# Patient Record
Sex: Male | Born: 1980
Health system: Southern US, Community
[De-identification: ages and names within clinical notes are randomized; demographics above are authoritative.]

## PROBLEM LIST (undated history)

## (undated) DIAGNOSIS — N200 Calculus of kidney: Secondary | ICD-10-CM

## (undated) DIAGNOSIS — F1020 Alcohol dependence, uncomplicated: Secondary | ICD-10-CM

## (undated) HISTORY — DX: Alcohol dependence, uncomplicated: F10.20

---

## 2002-03-22 ENCOUNTER — Emergency Department (HOSPITAL_COMMUNITY): Admission: EM | Admit: 2002-03-22 | Discharge: 2002-03-22 | Payer: Self-pay | Admitting: Emergency Medicine

## 2002-03-22 ENCOUNTER — Encounter: Payer: Self-pay | Admitting: Emergency Medicine

## 2006-09-02 ENCOUNTER — Emergency Department (HOSPITAL_COMMUNITY): Admission: EM | Admit: 2006-09-02 | Discharge: 2006-09-02 | Payer: Self-pay | Admitting: Family Medicine

## 2018-08-27 ENCOUNTER — Encounter (HOSPITAL_COMMUNITY): Payer: Self-pay | Admitting: Emergency Medicine

## 2018-08-27 ENCOUNTER — Emergency Department (HOSPITAL_COMMUNITY): Payer: BLUE CROSS/BLUE SHIELD

## 2018-08-27 ENCOUNTER — Emergency Department (HOSPITAL_COMMUNITY)
Admission: EM | Admit: 2018-08-27 | Discharge: 2018-08-27 | Disposition: A | Discharge status: Home / Self Care | Payer: BLUE CROSS/BLUE SHIELD | Attending: Emergency Medicine | Admitting: Emergency Medicine

## 2018-08-27 DIAGNOSIS — J069 Acute upper respiratory infection, unspecified: Secondary | ICD-10-CM | POA: Diagnosis not present

## 2018-08-27 DIAGNOSIS — R05 Cough: Secondary | ICD-10-CM | POA: Diagnosis not present

## 2018-08-27 DIAGNOSIS — R079 Chest pain, unspecified: Secondary | ICD-10-CM | POA: Diagnosis not present

## 2018-08-27 LAB — BASIC METABOLIC PANEL
Anion gap: 11 (ref 5–15)
BUN: 9 mg/dL (ref 6–20)
CO2: 26 mmol/L (ref 22–32)
Calcium: 9.1 mg/dL (ref 8.9–10.3)
Chloride: 101 mmol/L (ref 98–111)
Creatinine, Ser: 1.03 mg/dL (ref 0.61–1.24)
GFR calc Af Amer: >60 mL/min (ref >60)
GFR calc non Af Amer: >60 mL/min (ref >60)
Glucose, Bld: 101 mg/dL — ABNORMAL HIGH (ref 70–99)
Potassium: 3.9 mmol/L (ref 3.5–5.1)
Sodium: 138 mmol/L (ref 135–145)

## 2018-08-27 LAB — CBC
HCT: 47.8 % (ref 39.0–52.0)
Hemoglobin: 14.6 g/dL (ref 13.0–17.0)
MCH: 26.2 pg (ref 26.0–34.0)
MCHC: 30.5 g/dL (ref 30.0–36.0)
MCV: 85.7 fL (ref 80.0–100.0)
Platelets: 298 10*3/uL (ref 150–400)
RBC: 5.58 MIL/uL (ref 4.22–5.81)
RDW: 12.8 % (ref 11.5–15.5)
WBC: 8.6 10*3/uL (ref 4.0–10.5)
nRBC: 0 % (ref 0.0–0.2)

## 2018-08-27 MED ORDER — DOXYCYCLINE HYCLATE 100 MG PO TABS: 100.0000 mg | ORAL_TABLET | Freq: Once | ORAL | Status: DC

## 2018-08-27 MED ORDER — DOXYCYCLINE HYCLATE 100 MG PO CAPS: 100.0000 mg | ORAL_CAPSULE | Freq: Two times a day (BID) | ORAL | 0 refills | Status: DC

## 2018-08-27 MED ORDER — PREDNISONE 20 MG PO TABS: 60.0000 mg | ORAL_TABLET | Freq: Once | ORAL | Status: DC

## 2018-08-27 MED ORDER — ALBUTEROL SULFATE HFA 108 (90 BASE) MCG/ACT IN AERS: 2.0000 | INHALATION_SPRAY | Freq: Once | RESPIRATORY_TRACT | Status: DC

## 2018-08-27 MED ORDER — PREDNISONE 20 MG PO TABS: ORAL_TABLET | ORAL | 0 refills | Status: DC

## 2018-08-27 NOTE — ED Provider Notes (Signed)
Emergency Department Provider Note   I have reviewed the triage vital signs and the nursing notes.   HISTORY  Chief Complaint URI   HPI Erik Garza is a 37 y.o. male with 2 and half weeks of worsening productive cough, shortness of breath and pleuritic pain in the last day or 2.  No fevers.  No lower extremity swelling.  No abdominal symptoms. No vomiting. No other associated or modifying symptoms.    History reviewed. No pertinent past medical history.  There are no active problems to display for this patient.   History reviewed. No pertinent surgical history.  Current Outpatient Rx  . Order #: [id number] Class: Print  . Order #: [id number] Class: Print    Allergies Patient has no known allergies.  History reviewed. No pertinent family history.  Social History Social History   Tobacco Use  . Smoking status: Never Smoker  . Smokeless tobacco: Never Used  Substance Use Topics  . Alcohol use: Not on file  . Drug use: Not on file    Review of Systems  All other systems negative except as documented in the HPI. All pertinent positives and negatives as reviewed in the HPI. ____________________________________________   PHYSICAL EXAM:  VITAL SIGNS: ED Triage Vitals  Enc Vitals Group     BP 08/27/18 0403 133/83     Pulse Rate 08/27/18 0403 89     Resp 08/27/18 0403 17     Temp 08/27/18 0403 98.5 F (36.9 C)     Temp Source 08/27/18 0403 Oral     SpO2 08/27/18 0403 99 %    Constitutional: Alert and oriented. Well appearing and in no acute distress. Eyes: Conjunctivae are normal. PERRL. EOMI. Head: Atraumatic. Nose: No congestion/rhinnorhea. Mouth/Throat: Mucous membranes are moist.  Oropharynx non-erythematous. Neck: No stridor.  No meningeal signs.   Cardiovascular: Normal rate, regular rhythm. Good peripheral circulation. Grossly normal heart sounds.   Respiratory: Normal respiratory effort.  No retractions. Lungs diminished  bilaterally. Gastrointestinal: Soft and nontender. No distention.  Musculoskeletal: No lower extremity tenderness nor edema. No gross deformities of extremities. Neurologic:  Normal speech and language. No gross focal neurologic deficits are appreciated.  Skin:  Skin is warm, dry and intact. No rash noted.   ____________________________________________   LABS (all labs ordered are listed, but only abnormal results are displayed)  Labs Reviewed  BASIC METABOLIC PANEL - Abnormal; Notable for the following components:      Result Value   Glucose, Bld 101 (*)    All other components within normal limits  CBC   ____________________________________________  RADIOLOGY  Dg Chest 2 View  Result Date: 08/27/2018 CLINICAL DATA:  Cough, congestion and chest pain for 2 and half weeks. EXAM: CHEST - 2 VIEW COMPARISON:  None. FINDINGS: Cardiomediastinal silhouette is normal. No pleural effusions or focal consolidations. Trachea projects midline and there is no pneumothorax. Soft tissue planes and included osseous structures are non-suspicious. IMPRESSION: Normal chest radiograph. Electronically Signed   By: Awilda Metroourtnay  Bloomer M.D.   On: 08/27/2018 04:29    ____________________________________________   PROCEDURES  Procedure(s) performed:   Procedures   ____________________________________________   INITIAL IMPRESSION / ASSESSMENT AND PLAN / ED COURSE  Likely bronchitis but with 2 and half weeks of progressively worsening productive cough also consider atypical pneumonia so we will treat with antibiotics, steroids and inhaler.  Due to lack of risk factors and atypical story Low suspicion for PE or cardiac causes at this time.     Pertinent labs &  imaging results that were available during my care of the patient were reviewed by me and considered in my medical decision making (see chart for details).  ____________________________________________  FINAL CLINICAL IMPRESSION(S) / ED  DIAGNOSES  Final diagnoses:  Upper respiratory tract infection, unspecified type     MEDICATIONS GIVEN DURING THIS VISIT:  Medications  albuterol (PROVENTIL HFA;VENTOLIN HFA) 108 (90 Base) MCG/ACT inhaler 2 puff (has no administration in time range)  predniSONE (DELTASONE) tablet 60 mg (has no administration in time range)  doxycycline (VIBRA-TABS) tablet 100 mg (has no administration in time range)     NEW OUTPATIENT MEDICATIONS STARTED DURING THIS VISIT:  New Prescriptions   DOXYCYCLINE (VIBRAMYCIN) 100 MG CAPSULE    Take 1 capsule (100 mg total) by mouth 2 (two) times daily. One po bid x 7 days   PREDNISONE (DELTASONE) 20 MG TABLET    2 tabs po daily x 4 days    Note:  This note was prepared with assistance of Dragon voice recognition software. Occasional wrong-word or sound-a-like substitutions may have occurred due to the inherent limitations of voice recognition software.   Marily Memos, MD 08/27/18 [phone]

## 2018-08-27 NOTE — ED Triage Notes (Signed)
Pt here with c/o cough times 2 weeks , no fevers , pt has had some yellow sputum

## 2019-11-19 DIAGNOSIS — N521 Erectile dysfunction due to diseases classified elsewhere: Secondary | ICD-10-CM | POA: Diagnosis not present

## 2019-11-19 DIAGNOSIS — R102 Pelvic and perineal pain: Secondary | ICD-10-CM | POA: Diagnosis not present

## 2019-11-19 DIAGNOSIS — Z6830 Body mass index (BMI) 30.0-30.9, adult: Secondary | ICD-10-CM | POA: Diagnosis not present

## 2019-11-19 DIAGNOSIS — F064 Anxiety disorder due to known physiological condition: Secondary | ICD-10-CM | POA: Diagnosis not present

## 2019-11-19 DIAGNOSIS — E299 Testicular dysfunction, unspecified: Secondary | ICD-10-CM | POA: Diagnosis not present

## 2019-11-19 DIAGNOSIS — F3289 Other specified depressive episodes: Secondary | ICD-10-CM | POA: Diagnosis not present

## 2019-11-21 ENCOUNTER — Other Ambulatory Visit: Payer: Self-pay | Admitting: Family Medicine

## 2019-11-21 ENCOUNTER — Ambulatory Visit
Admission: RE | Admit: 2019-11-21 | Discharge: 2019-11-21 | Disposition: A | Discharge status: Home / Self Care | Payer: BLUE CROSS/BLUE SHIELD | Source: Ambulatory Visit | Attending: Family Medicine | Admitting: Family Medicine

## 2019-11-21 ENCOUNTER — Other Ambulatory Visit: Payer: Self-pay

## 2019-11-21 DIAGNOSIS — R102 Pelvic and perineal pain: Secondary | ICD-10-CM

## 2019-12-23 DIAGNOSIS — E785 Hyperlipidemia, unspecified: Secondary | ICD-10-CM | POA: Diagnosis not present

## 2020-04-26 DIAGNOSIS — F064 Anxiety disorder due to known physiological condition: Secondary | ICD-10-CM | POA: Diagnosis not present

## 2020-04-26 DIAGNOSIS — E1169 Type 2 diabetes mellitus with other specified complication: Secondary | ICD-10-CM | POA: Diagnosis not present

## 2020-08-26 DIAGNOSIS — F064 Anxiety disorder due to known physiological condition: Secondary | ICD-10-CM | POA: Diagnosis not present

## 2020-08-26 DIAGNOSIS — F3289 Other specified depressive episodes: Secondary | ICD-10-CM | POA: Diagnosis not present

## 2020-08-26 DIAGNOSIS — E782 Mixed hyperlipidemia: Secondary | ICD-10-CM | POA: Diagnosis not present

## 2020-08-26 DIAGNOSIS — F10939 Alcohol use, unspecified with withdrawal, unspecified: Secondary | ICD-10-CM | POA: Diagnosis not present

## 2020-10-04 ENCOUNTER — Other Ambulatory Visit: Payer: Self-pay | Admitting: Family Medicine

## 2020-10-04 DIAGNOSIS — Z87442 Personal history of urinary calculi: Secondary | ICD-10-CM | POA: Diagnosis not present

## 2020-10-04 DIAGNOSIS — F064 Anxiety disorder due to known physiological condition: Secondary | ICD-10-CM | POA: Diagnosis not present

## 2020-10-04 DIAGNOSIS — N521 Erectile dysfunction due to diseases classified elsewhere: Secondary | ICD-10-CM | POA: Diagnosis not present

## 2020-10-04 DIAGNOSIS — R109 Unspecified abdominal pain: Secondary | ICD-10-CM

## 2020-10-04 DIAGNOSIS — E782 Mixed hyperlipidemia: Secondary | ICD-10-CM | POA: Diagnosis not present

## 2020-10-07 DIAGNOSIS — F064 Anxiety disorder due to known physiological condition: Secondary | ICD-10-CM | POA: Diagnosis not present

## 2020-10-07 DIAGNOSIS — Z7189 Other specified counseling: Secondary | ICD-10-CM | POA: Diagnosis not present

## 2020-10-07 DIAGNOSIS — Z6826 Body mass index (BMI) 26.0-26.9, adult: Secondary | ICD-10-CM | POA: Diagnosis not present

## 2020-10-07 DIAGNOSIS — R310 Gross hematuria: Secondary | ICD-10-CM | POA: Diagnosis not present

## 2020-10-14 ENCOUNTER — Ambulatory Visit
Admission: RE | Admit: 2020-10-14 | Discharge: 2020-10-14 | Disposition: A | Discharge status: Home / Self Care | Payer: BC Managed Care – PPO | Source: Ambulatory Visit | Attending: Family Medicine | Admitting: Family Medicine

## 2020-10-14 DIAGNOSIS — R109 Unspecified abdominal pain: Secondary | ICD-10-CM | POA: Diagnosis not present

## 2020-11-08 ENCOUNTER — Encounter (HOSPITAL_BASED_OUTPATIENT_CLINIC_OR_DEPARTMENT_OTHER): Payer: Self-pay | Admitting: Emergency Medicine

## 2020-11-08 ENCOUNTER — Other Ambulatory Visit: Payer: Self-pay

## 2020-11-08 ENCOUNTER — Emergency Department (HOSPITAL_BASED_OUTPATIENT_CLINIC_OR_DEPARTMENT_OTHER)
Admission: EM | Admit: 2020-11-08 | Discharge: 2020-11-08 | Disposition: A | Discharge status: Home / Self Care | Payer: BC Managed Care – PPO | Attending: Emergency Medicine | Admitting: Emergency Medicine

## 2020-11-08 DIAGNOSIS — R319 Hematuria, unspecified: Secondary | ICD-10-CM | POA: Insufficient documentation

## 2020-11-08 HISTORY — DX: Calculus of kidney: N20.0

## 2020-11-08 LAB — COMPREHENSIVE METABOLIC PANEL
ALT: 28 U/L (ref 0–44)
AST: 32 U/L (ref 15–41)
Albumin: 4.9 g/dL (ref 3.5–5.0)
Alkaline Phosphatase: 58 U/L (ref 38–126)
Anion gap: 14 (ref 5–15)
BUN: 8 mg/dL (ref 6–20)
CO2: 25 mmol/L (ref 22–32)
Calcium: 9.7 mg/dL (ref 8.9–10.3)
Chloride: 98 mmol/L (ref 98–111)
Creatinine, Ser: 0.91 mg/dL (ref 0.61–1.24)
GFR, Estimated: >60 mL/min (ref >60)
Glucose, Bld: 98 mg/dL (ref 70–99)
Potassium: 3.7 mmol/L (ref 3.5–5.1)
Sodium: 137 mmol/L (ref 135–145)
Total Bilirubin: 1 mg/dL (ref 0.3–1.2)
Total Protein: 7.9 g/dL (ref 6.5–8.1)

## 2020-11-08 LAB — URINALYSIS, ROUTINE W REFLEX MICROSCOPIC
Glucose, UA: NEGATIVE mg/dL
Ketones, ur: NEGATIVE mg/dL
Leukocytes,Ua: NEGATIVE
Nitrite: NEGATIVE
Protein, ur: NEGATIVE mg/dL
Specific Gravity, Urine: 1.01 (ref 1.005–1.030)
pH: 6 (ref 5.0–8.0)

## 2020-11-08 LAB — CBC WITH DIFFERENTIAL/PLATELET
Abs Immature Granulocytes: 0.05 10*3/uL (ref 0.00–0.07)
Basophils Absolute: 0 10*3/uL (ref 0.0–0.1)
Basophils Relative: 0 %
Eosinophils Absolute: 0.1 10*3/uL (ref 0.0–0.5)
Eosinophils Relative: 1 %
HCT: 48.7 % (ref 39.0–52.0)
Hemoglobin: 15.9 g/dL (ref 13.0–17.0)
Immature Granulocytes: 1 %
Lymphocytes Relative: 9 %
Lymphs Abs: 0.7 10*3/uL (ref 0.7–4.0)
MCH: 28.2 pg (ref 26.0–34.0)
MCHC: 32.6 g/dL (ref 30.0–36.0)
MCV: 86.3 fL (ref 80.0–100.0)
Monocytes Absolute: 0.8 10*3/uL (ref 0.1–1.0)
Monocytes Relative: 11 %
Neutro Abs: 5.8 10*3/uL (ref 1.7–7.7)
Neutrophils Relative %: 78 %
Platelets: 357 10*3/uL (ref 150–400)
RBC: 5.64 MIL/uL (ref 4.22–5.81)
RDW: 12.7 % (ref 11.5–15.5)
WBC: 7.5 10*3/uL (ref 4.0–10.5)
nRBC: 0 % (ref 0.0–0.2)

## 2020-11-08 LAB — URINALYSIS, MICROSCOPIC (REFLEX)

## 2020-11-08 MED ORDER — CEPHALEXIN 500 MG PO CAPS: 500.0000 mg | ORAL_CAPSULE | Freq: Four times a day (QID) | ORAL | 0 refills | Status: AC

## 2020-11-08 NOTE — ED Triage Notes (Signed)
2 weeks ago pt passed 2 kidney stones.  Pt states that after he has sex he passes clots and blood.  Pt had sex on Saturday and began to have blood and clots in urine. Pt states that he feels that he has to force out urine.  Denies any back or flank pain.  Pt has been taking urinary pills AZO last night and has taken 2 this am

## 2020-11-08 NOTE — ED Provider Notes (Signed)
[facility] EMERGENCY DEPARTMENT Provider Note   CSN: [id number] Arrival date & time: 11/08/20  8841     History Chief Complaint  Patient presents with  . Hematuria    Passing clots    Erik Garza is a 40 y.o. male.  40 year old male with a PMH of kidney stones presents to the ED with a chief complaint of hematuria x 3 days. Patient reports a prior episode of kidney stones on 09/27/2020 having this managed by his PCP. He has had no issues since but was sexually active 3 days ago, reports noticing hematuria post void.  This occurred once again today after he attempted to urinated, describing this as a discomfort, such as pressure like "something is blocking his urinary output".  Does report taking Azo pills yesterday and today but has continued to pass small clots with his urine.  He is currently sexually active, with 2 partner since pneumaturia began, does not report any concern for STI as he reports condom use with both encounters.  There is no penile discharge, testicular swelling, testicular pain, abdominal pain.  No prior history of BPH, does not have any prior urology follow-up.     The history is provided by the patient.       Past Medical History:  Diagnosis Date  . Kidney stones     There are no problems to display for this patient.   History reviewed. No pertinent surgical history.     History reviewed. No pertinent family history.  Social History   Tobacco Use  . Smoking status: Never Smoker  . Smokeless tobacco: Never Used  Substance Use Topics  . Alcohol use: Yes  . Drug use: Never    Home Medications Prior to Admission medications   Medication Sig Start Date End Date Taking? Authorizing Provider  cephALEXin (KEFLEX) 500 MG capsule Take 1 capsule (500 mg total) by mouth 4 (four) times daily for 7 days. 11/08/20 11/15/20 Yes Soto, Leonie Douglas, PA-C  citalopram (CELEXA) 10 MG tablet Take 10 mg by mouth daily.   Yes [provider]   doxycycline (VIBRAMYCIN) 100 MG capsule Take 1 capsule (100 mg total) by mouth 2 (two) times daily. One po bid x 7 days 08/27/18   Mesner, Barbara Cower, MD  predniSONE (DELTASONE) 20 MG tablet 2 tabs po daily x 4 days 08/27/18   Mesner, Barbara Cower, MD    Allergies    Patient has no known allergies.  Review of Systems   Review of Systems  Constitutional: Negative for fever.  Gastrointestinal: Negative for abdominal pain, nausea and vomiting.  Genitourinary: Positive for decreased urine volume, difficulty urinating, hematuria and urgency. Negative for flank pain, frequency, genital sores, penile discharge, penile pain, penile swelling, scrotal swelling and testicular pain.    Physical Exam Updated Vital Signs BP 131/85   Pulse 62   Temp 97.7 F (36.5 C) (Oral)   Resp 14   Ht 5\' 11"  (1.803 m)   Wt 77.1 kg   SpO2 97%   BMI 23.71 kg/m   Physical Exam Vitals and nursing note reviewed.  Constitutional:      Appearance: Normal appearance. He is not ill-appearing or toxic-appearing.  HENT:     Head: Normocephalic and atraumatic.     Nose: Nose normal.  Cardiovascular:     Rate and Rhythm: Normal rate.  Pulmonary:     Effort: Pulmonary effort is normal.  Abdominal:     General: Abdomen is flat.  Musculoskeletal:     Cervical back:  Normal range of motion and neck supple.  Skin:    General: Skin is warm and dry.  Neurological:     Mental Status: He is alert and oriented to person, place, and time.     ED Results / Procedures / Treatments   Labs (all labs ordered are listed, but only abnormal results are displayed) Labs Reviewed  URINALYSIS, ROUTINE W REFLEX MICROSCOPIC - Abnormal; Notable for the following components:      Result Value   Color, Urine ORANGE (*)    Hgb urine dipstick LARGE (*)    Bilirubin Urine SMALL (*)    All other components within normal limits  URINALYSIS, MICROSCOPIC (REFLEX) - Abnormal; Notable for the following components:   Bacteria, UA RARE (*)    All  other components within normal limits  URINE CULTURE  CBC WITH DIFFERENTIAL/PLATELET  COMPREHENSIVE METABOLIC PANEL  GC/CHLAMYDIA PROBE AMP ([place]) NOT AT [facility]    EKG None  Radiology No results found.  Procedures Procedures   Medications Ordered in ED Medications - No data to display  ED Course  I have reviewed the triage vital signs and the nursing notes.  Pertinent labs & imaging results that were available during my care of the patient were reviewed by me and considered in my medical decision making (see chart for details).  Clinical Course as of 11/08/20 1121  Mon Nov 08, 2020  1053 Bacteria, UA(!): RARE [JS]  1054 Bilirubin Urine(!): SMALL [JS]  1054 Hgb urine dipstick(!): LARGE [JS]  1102 Creatinine: 0.91 [JS]    Clinical Course User Index [JS] Claude Manges, PA-C   MDM Rules/Calculators/A&P  Patient with a past medical history of kidney stones presents to the ED with a chief complaint of hematuria x3 days.  Similar episode occurred back in the month of January, when he had an ultrasound by his PCP which show multiple kidney stones, he did have some episodes of hematuria but this later resolved.  He has been taking Azo since yesterday to help with symptoms, has passed small clots this morning, describes his urine as being forced out, there is decrease in urinary output as he feels like he has to push his urine out and when he is successful small clots come out.  No prior history of BPH, no abdominal pain, no fever or other symptoms.  He is currently sexually active, does not have any concern for sexually transmitted infection.  During evaluation he is overall nontoxic, non-ill-appearing.  Interpretation of his labs reveal a CMP without any elevation in his creatinine, this is normal, LFTs are unremarkable.  CBC without any leukocytosis, his hemoglobin is stable.  UA does show orange color, suspect likely due to Azo usage, large hemoglobin noted along with small bili.   Bacteria is rare.  Urine has also been sent for culture.  We discussed gonorrhea and Chlamydia testing at this time, will add this onto the urine as he reports lower suspicion with protected encounters. There is no flank pain, no fevers have a low suspicion for kidney stone or [place] at this time with a clean urine.  We discussed appropriate follow-up with urology, he will also go home on a course of Keflex to help with any urinary tract infection.  He is to follow-up with urology.  He also is aware that he will need to return to the ED if he experiences any urinary retention greater than 8 hours.Patient understands and agrees with management. Patient stable for discharge.   Portions of  this note were generated with Scientist, clinical (histocompatibility and immunogenetics). Dictation errors may occur despite best attempts at proofreading.  Final Clinical Impression(s) / ED Diagnoses Final diagnoses:  Hematuria, unspecified type    Rx / DC Orders ED Discharge Orders         Ordered    cephALEXin (KEFLEX) 500 MG capsule  4 times daily        11/08/20 [address] PA-C 11/08/20 1121    Virgina Norfolk, DO 11/08/20 1328

## 2020-11-08 NOTE — Discharge Instructions (Addendum)
I have prescribed a short course of antibiotics to help rent infection. Please take 1 tablet 4 times a day for the next 7 days.  We discussed appropriate follow-up with your primary care physician along with urology.   If you experience any urinary retention greater than 5-8 hours please return to the ED. Or if you experience any bladder distention return to the ED immediately.

## 2020-11-09 LAB — URINE CULTURE: Culture: NO GROWTH

## 2020-11-09 LAB — GC/CHLAMYDIA PROBE AMP (~~LOC~~) NOT AT ARMC
Chlamydia: NEGATIVE
Comment: NEGATIVE
Comment: NORMAL
Neisseria Gonorrhea: NEGATIVE

## 2020-11-22 DIAGNOSIS — E782 Mixed hyperlipidemia: Secondary | ICD-10-CM | POA: Diagnosis not present

## 2020-11-22 DIAGNOSIS — F064 Anxiety disorder due to known physiological condition: Secondary | ICD-10-CM | POA: Diagnosis not present

## 2020-11-22 DIAGNOSIS — R102 Pelvic and perineal pain: Secondary | ICD-10-CM | POA: Diagnosis not present

## 2020-12-09 DIAGNOSIS — N200 Calculus of kidney: Secondary | ICD-10-CM | POA: Diagnosis not present

## 2020-12-09 DIAGNOSIS — R310 Gross hematuria: Secondary | ICD-10-CM | POA: Diagnosis not present

## 2021-05-26 DIAGNOSIS — E782 Mixed hyperlipidemia: Secondary | ICD-10-CM | POA: Diagnosis not present

## 2021-05-26 DIAGNOSIS — E785 Hyperlipidemia, unspecified: Secondary | ICD-10-CM | POA: Diagnosis not present

## 2021-05-26 DIAGNOSIS — F064 Anxiety disorder due to known physiological condition: Secondary | ICD-10-CM | POA: Diagnosis not present

## 2021-05-26 DIAGNOSIS — N521 Erectile dysfunction due to diseases classified elsewhere: Secondary | ICD-10-CM | POA: Diagnosis not present

## 2021-05-26 DIAGNOSIS — F10939 Alcohol use, unspecified with withdrawal, unspecified: Secondary | ICD-10-CM | POA: Diagnosis not present

## 2021-05-26 DIAGNOSIS — R310 Gross hematuria: Secondary | ICD-10-CM | POA: Diagnosis not present

## 2021-06-09 DIAGNOSIS — F10939 Alcohol use, unspecified with withdrawal, unspecified: Secondary | ICD-10-CM | POA: Diagnosis not present

## 2021-06-09 DIAGNOSIS — E1169 Type 2 diabetes mellitus with other specified complication: Secondary | ICD-10-CM | POA: Diagnosis not present

## 2021-06-09 DIAGNOSIS — E785 Hyperlipidemia, unspecified: Secondary | ICD-10-CM | POA: Diagnosis not present

## 2021-06-09 DIAGNOSIS — F064 Anxiety disorder due to known physiological condition: Secondary | ICD-10-CM | POA: Diagnosis not present

## 2021-11-10 ENCOUNTER — Other Ambulatory Visit: Payer: Self-pay | Admitting: Family Medicine

## 2021-11-11 LAB — CBC WITH DIFFERENTIAL/PLATELET
Absolute Monocytes: 578 cells/uL (ref 200–950)
Basophils Absolute: 30 cells/uL (ref 0–200)
Basophils Relative: 0.5 %
Eosinophils Absolute: 71 cells/uL (ref 15–500)
Eosinophils Relative: 1.2 %
HCT: 41.5 % (ref 38.5–50.0)
Hemoglobin: 13.7 g/dL (ref 13.2–17.1)
Lymphs Abs: 631 cells/uL — ABNORMAL LOW (ref 850–3900)
MCH: 28.1 pg (ref 27.0–33.0)
MCHC: 33 g/dL (ref 32.0–36.0)
MCV: 85.2 fL (ref 80.0–100.0)
MPV: 10 fL (ref 7.5–12.5)
Monocytes Relative: 9.8 %
Neutro Abs: 4590 cells/uL (ref 1500–7800)
Neutrophils Relative %: 77.8 %
Platelets: 260 10*3/uL (ref 140–400)
RBC: 4.87 10*6/uL (ref 4.20–5.80)
RDW: 12.4 % (ref 11.0–15.0)
Total Lymphocyte: 10.7 %
WBC: 5.9 10*3/uL (ref 3.8–10.8)

## 2021-11-11 LAB — COMPLETE METABOLIC PANEL WITH GFR
AG Ratio: 2.1 (calc) (ref 1.0–2.5)
ALT: 22 U/L (ref 9–46)
AST: 20 U/L (ref 10–40)
Albumin: 4.4 g/dL (ref 3.6–5.1)
Alkaline phosphatase (APISO): 36 U/L (ref 36–130)
BUN: 8 mg/dL (ref 7–25)
CO2: 30 mmol/L (ref 20–32)
Calcium: 9.5 mg/dL (ref 8.6–10.3)
Chloride: 105 mmol/L (ref 98–110)
Creat: 1.03 mg/dL (ref 0.60–1.29)
Globulin: 2.1 g/dL (calc) (ref 1.9–3.7)
Glucose, Bld: 93 mg/dL (ref 65–139)
Potassium: 4.6 mmol/L (ref 3.5–5.3)
Sodium: 141 mmol/L (ref 135–146)
Total Bilirubin: 0.5 mg/dL (ref 0.2–1.2)
Total Protein: 6.5 g/dL (ref 6.1–8.1)
eGFR: 94 mL/min/{1.73_m2} (ref >=60)

## 2021-11-11 LAB — C. TRACHOMATIS/N. GONORRHOEAE RNA
C. trachomatis RNA, TMA: NOT DETECTED
N. gonorrhoeae RNA, TMA: NOT DETECTED

## 2021-11-11 LAB — URINALYSIS W MICROSCOPIC + REFLEX CULTURE

## 2021-11-11 LAB — TRICHOMONAS VAGINALIS RNA, QL,MALES: Trichomonas vaginalis RNA: NOT DETECTED

## 2021-12-12 ENCOUNTER — Encounter: Payer: Self-pay | Admitting: Family Medicine

## 2021-12-12 ENCOUNTER — Ambulatory Visit: Payer: 59 | Admitting: Family Medicine

## 2021-12-12 VITALS — BP 131/82 | HR 73 | Temp 97.5°F | Resp 12 | Ht 71.0 in | Wt 163.8 lb

## 2021-12-12 DIAGNOSIS — F10930 Alcohol use, unspecified with withdrawal, uncomplicated: Secondary | ICD-10-CM

## 2021-12-12 DIAGNOSIS — R001 Bradycardia, unspecified: Secondary | ICD-10-CM | POA: Diagnosis not present

## 2021-12-12 DIAGNOSIS — F1010 Alcohol abuse, uncomplicated: Secondary | ICD-10-CM | POA: Diagnosis not present

## 2021-12-12 LAB — POCT URINE DRUG SCREEN
POC Amphetamine UR: NOT DETECTED
POC BENZODIAZEPINES UR: NOT DETECTED
POC Barbiturate UR: NOT DETECTED
POC Cocaine UR: NOT DETECTED
POC DRUG SCREEN OXIDANTS URINE: NORMAL
POC Marijuana UR: NOT DETECTED
POC Methadone UR: NOT DETECTED
POC Methamphetamine UR: NOT DETECTED
POC Opiate Ur: NOT DETECTED
POC Oxycodone UR: NOT DETECTED

## 2021-12-12 MED ORDER — CHLORDIAZEPOXIDE HCL 25 MG PO CAPS: 50.0000 mg | ORAL_CAPSULE | ORAL | 0 refills | Status: DC

## 2021-12-12 MED ORDER — ONDANSETRON HCL 4 MG PO TABS: 4.0000 mg | ORAL_TABLET | Freq: Three times a day (TID) | ORAL | 0 refills | Status: DC | PRN

## 2021-12-12 MED ORDER — FOLIC ACID 1 MG PO TABS: 1.0000 mg | ORAL_TABLET | Freq: Every day | ORAL | 0 refills | Status: DC

## 2021-12-12 MED ORDER — CLONIDINE HCL 0.1 MG PO TABS: 0.1000 mg | ORAL_TABLET | Freq: Three times a day (TID) | ORAL | 3 refills | Status: DC | PRN

## 2021-12-12 MED ORDER — VITAMIN B-12 1000 MCG PO TABS: 1000.0000 ug | ORAL_TABLET | Freq: Every day | ORAL | 0 refills | Status: DC

## 2021-12-12 MED ORDER — ONE-A-DAY MENS PO TABS: 1.0000 | ORAL_TABLET | Freq: Every day | ORAL | 0 refills | Status: AC

## 2021-12-12 NOTE — Progress Notes (Signed)
? ?Erik Garza is an 41 y.o. male.  ?HPI: Pt here to start ETOH cessation. Quit date was Sunday. No signs of withdrawal yet. Pt says he did drink a lot Saturday and was hung over Sunday. Denies drug use. Goal is complete abstinence. Denies hx of DTs and hospital stays for withdrawal.  ? ?Pt states he is nearly a daily drinker at baseline. Mix of beer and liquor is normal. Motivation to stop is his significant other pushing him and his health. He ha snoticed stopping drinking does give him "shakes". ? ?Past Medical History:  ?Diagnosis Date  ? EtOH dependence (HCC)   ? Kidney stones   ? ? ?No past surgical history on file. ?Reviewed, neg hx. ? ?No family history on file. ?Reviewedm not pertinent. ? ?Social History:  reports that he has never smoked. He has never used smokeless tobacco. He reports current alcohol use. He reports that he does not use drugs. ? ?Allergies: No Known Allergies ? ?Medications: I have reviewed the patient's current medications. ? ?Results for orders placed or performed in visit on 12/12/21 (from the past 48 hour(s))  ?POCT Urine Drug Screen     Status: None  ? Collection Time: 12/12/21 11:30 AM  ?Result Value Ref Range  ? POC Methamphetamine UR None Detected None Detected  ? POC Opiate Ur None Detected None Detected  ? POC Barbiturate UR None Detected None Detected  ? POC Amphetamine UR None Detected None Detected  ? POC Oxycodone UR None Detected None Detected  ? POC Cocaine UR None Detected None Detected  ? POC Ecstasy UR    ? POC TRICYCLICS UR    ? POC PHENCYCLIDINE UR    ? POC Marijuana UR None Detected None Detected  ? POC Methadone UR None Detected None Detected  ? POC BENZODIAZEPINES UR None Detected None Detected  ? URINE TEMPERATURE    ? POC DRUG SCREEN OXIDANTS URINE Normal Normal  ? POC SPECIFIC GRAVITY URINE    ? POC PH URINE    ? Methylenedioxyamphetamine    ? ? ?No results found. ? ?Review of Systems  ?Constitutional:  Negative for activity change and appetite change.   ?HENT:  Negative for congestion and ear pain.   ?Respiratory:  Negative for cough, chest tightness and stridor.   ?Cardiovascular:  Negative for chest pain.  ?Gastrointestinal:  Negative for abdominal pain, nausea and vomiting.  ?Neurological:  Negative for tremors and weakness.  ?Psychiatric/Behavioral:  Negative for agitation and behavioral problems.   ?All other systems reviewed and are negative. ?Blood pressure 131/82, pulse 73, temperature (!) 97.5 ?F (36.4 ?C), temperature source Temporal, resp. rate 12, height 5\' 11"  (1.803 m), weight 163 lb 12.8 oz (74.3 kg), SpO2 98 %. ?Physical Exam ?Vitals and nursing note reviewed.  ?Constitutional:   ?   General: He is not in acute distress. ?   Appearance: Normal appearance. He is not ill-appearing, toxic-appearing or diaphoretic.  ?HENT:  ?   Head: Normocephalic and atraumatic.  ?   Mouth/Throat:  ?   Mouth: Mucous membranes are dry.  ?Eyes:  ?   General: No scleral icterus. ?   Extraocular Movements: Extraocular movements intact.  ?   Pupils: Pupils are equal, round, and reactive to light.  ?Cardiovascular:  ?   Rate and Rhythm: Normal rate.  ?   Pulses: Normal pulses.  ?   Heart sounds: Normal heart sounds. No murmur heard. ?  No friction rub. No gallop.  ?Pulmonary:  ?  Effort: Pulmonary effort is normal. No respiratory distress.  ?   Breath sounds: Normal breath sounds.  ?Abdominal:  ?   General: Abdomen is flat. Bowel sounds are normal. There is no distension.  ?   Palpations: Abdomen is soft. There is no mass.  ?   Tenderness: There is no abdominal tenderness.  ?Musculoskeletal:     ?   General: No deformity. Normal range of motion.  ?   Cervical back: Normal range of motion and neck supple.  ?Skin: ?   General: Skin is warm and dry.  ?   Capillary Refill: Capillary refill takes less than 2 seconds.  ?   Coloration: Skin is not jaundiced or pale.  ?   Findings: No bruising, erythema or rash.  ?Neurological:  ?   General: No focal deficit present.  ?   Mental  Status: He is alert and oriented to person, place, and time. Mental status is at baseline.  ?Psychiatric:     ?   Mood and Affect: Mood normal.     ?   Behavior: Behavior normal.     ?   Thought Content: Thought content normal.     ?   Judgment: Judgment normal.  ? ?EKG: unchanged from previous tracings, normal sinus rhythm, sinus bradycardia. HR 54, PR 141, QRS 81. No stemi. ? ?Assessment/Plan: ?Acie was seen today for follow-up. ? ?Diagnoses and all orders for this visit: ? ?Alcohol abuse ?-     POCT Urine Drug Screen ?-     cloNIDine (CATAPRES) 0.1 MG tablet; Take 1 tablet (0.1 mg total) by mouth 3 (three) times daily as needed (BP >180 systolic). ?-     ondansetron (ZOFRAN) 4 MG tablet; Take 1 tablet (4 mg total) by mouth every 8 (eight) hours as needed for nausea or vomiting. ?-     multivitamin (ONE-A-DAY MEN'S) TABS tablet; Take 1 tablet by mouth daily. ? ?Alcohol withdrawal syndrome without complication (HCC) ?-     cloNIDine (CATAPRES) 0.1 MG tablet; Take 1 tablet (0.1 mg total) by mouth 3 (three) times daily as needed (BP >180 systolic). ?-     ondansetron (ZOFRAN) 4 MG tablet; Take 1 tablet (4 mg total) by mouth every 8 (eight) hours as needed for nausea or vomiting. ?-     folic acid (FOLVITE) 1 MG tablet; Take 1 tablet (1 mg total) by mouth daily. ?-     vitamin B-12 (CYANOCOBALAMIN) 1000 MCG tablet; Take 1 tablet (1,000 mcg total) by mouth daily. ?-     chlordiazePOXIDE (LIBRIUM) 25 MG capsule; Take 2 capsules (50 mg total) by mouth as directed for 20 doses. Day 1-2 50mg  every 6 hours; day 3-4  50mg  every 8 hours; day 5-6 50mg  every 12 hours; day 6-7 50mg  at bedtime ? ?  ?Pt with neg PMP on my review in Epic. Seems to be a very good candidate for OP tx. Very motivated. Will start with meds above. Low threshold for admit if needed. After pt finishes withdrawal will place him on naltrexone and topamax for maintenance. AA was discussed. EKG nl. March labs reviewed. Pt will check BP at home twice  daily. ? ?Return precautions given at length. Pt VU. ? ?F/u Thursday in Clinic ? ? MD ?12/12/2021, 1:06 PM  ? ? ? ? ?

## 2021-12-15 ENCOUNTER — Ambulatory Visit: Payer: 59 | Admitting: Family Medicine

## 2021-12-20 ENCOUNTER — Ambulatory Visit: Payer: 59 | Admitting: Family Medicine

## 2022-01-04 ENCOUNTER — Other Ambulatory Visit: Payer: Self-pay | Admitting: Family Medicine

## 2022-01-04 DIAGNOSIS — F10930 Alcohol use, unspecified with withdrawal, uncomplicated: Secondary | ICD-10-CM

## 2022-01-09 ENCOUNTER — Ambulatory Visit: Payer: 59 | Admitting: Family Medicine

## 2022-02-09 ENCOUNTER — Other Ambulatory Visit: Payer: Self-pay | Admitting: Family Medicine

## 2022-02-09 DIAGNOSIS — F10930 Alcohol use, unspecified with withdrawal, uncomplicated: Secondary | ICD-10-CM

## 2022-02-15 ENCOUNTER — Ambulatory Visit: Payer: 59 | Admitting: Family Medicine

## 2022-03-14 ENCOUNTER — Other Ambulatory Visit: Payer: Self-pay | Admitting: Family Medicine

## 2022-03-14 DIAGNOSIS — F10930 Alcohol use, unspecified with withdrawal, uncomplicated: Secondary | ICD-10-CM

## 2022-04-19 ENCOUNTER — Other Ambulatory Visit: Payer: Self-pay | Admitting: Family Medicine

## 2022-04-19 DIAGNOSIS — F10930 Alcohol use, unspecified with withdrawal, uncomplicated: Secondary | ICD-10-CM

## 2022-06-30 ENCOUNTER — Encounter: Payer: Self-pay | Admitting: Family Medicine

## 2022-06-30 ENCOUNTER — Ambulatory Visit: Payer: 59 | Admitting: Family Medicine

## 2022-06-30 VITALS — BP 142/93 | HR 87 | Temp 97.1°F | Resp 96 | Ht 71.0 in | Wt 173.0 lb

## 2022-06-30 DIAGNOSIS — N410 Acute prostatitis: Secondary | ICD-10-CM

## 2022-06-30 DIAGNOSIS — Z87438 Personal history of other diseases of male genital organs: Secondary | ICD-10-CM

## 2022-06-30 MED ORDER — SULFAMETHOXAZOLE-TRIMETHOPRIM 400-80 MG PO TABS: 1.0000 | ORAL_TABLET | Freq: Two times a day (BID) | ORAL | 0 refills | Status: AC

## 2022-06-30 MED ORDER — ONDANSETRON 4 MG PO TBDP: 4.0000 mg | ORAL_TABLET | Freq: Three times a day (TID) | ORAL | 0 refills | Status: DC | PRN

## 2022-06-30 NOTE — Progress Notes (Unsigned)
   Established Patient Office Visit  Subjective   Patient ID: Erik Garza, male    DOB: 1981-08-15  Age: 41 y.o. MRN: [id number]  Chief Complaint  Patient presents with   Follow-up    Pt. C/o prostate infection.     HPI  {History (Optional):23778}  ROS    Objective:     BP (!) 142/93   Pulse 87   Temp (!) 97.1 F (36.2 C)   Resp (!) 96   Ht 5\' 11"  (1.803 m)   Wt 173 lb (78.5 kg)   BMI 24.13 kg/m  {Vitals History (Optional):23777}  Physical Exam   No results found for any visits on 06/30/22.  {Labs (Optional):23779}  The ASCVD Risk score (Arnett DK, et al., 2019) failed to calculate for the following reasons:   Cannot find a previous HDL lab   Cannot find a previous total cholesterol lab    Assessment & Plan:   Problem List Items Addressed This Visit   None   No follow-ups on file.    Elwin Mocha, MD

## 2022-07-03 LAB — CHLAMYDIA/GONOCOCCUS/TRICHOMONAS, NAA
Chlamydia by NAA: NEGATIVE
Gonococcus by NAA: NEGATIVE
Trich vag by NAA: NEGATIVE

## 2022-07-22 IMAGING — US US ABDOMEN COMPLETE
1 series · 14 of 25 positions shown · non-contrast
Comparison: None.

CLINICAL DATA: Abdominal pain

EXAM:
ABDOMEN ULTRASOUND COMPLETE

[Series 1: us abdomen complete · 0.20mm/px · 14 of 85 slices shown]
[im 1/85]
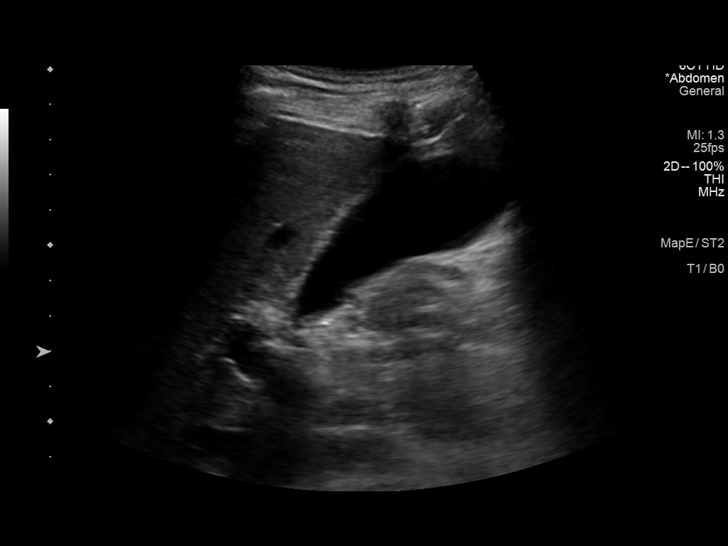
[im 8/85]
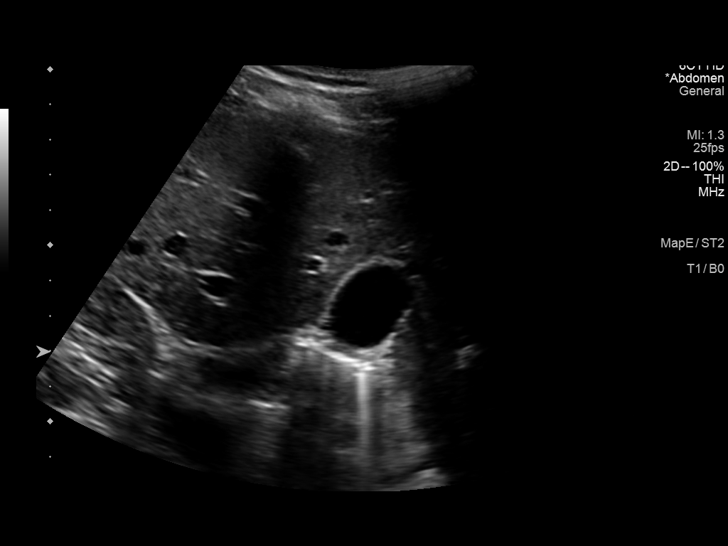
[im 15/85]
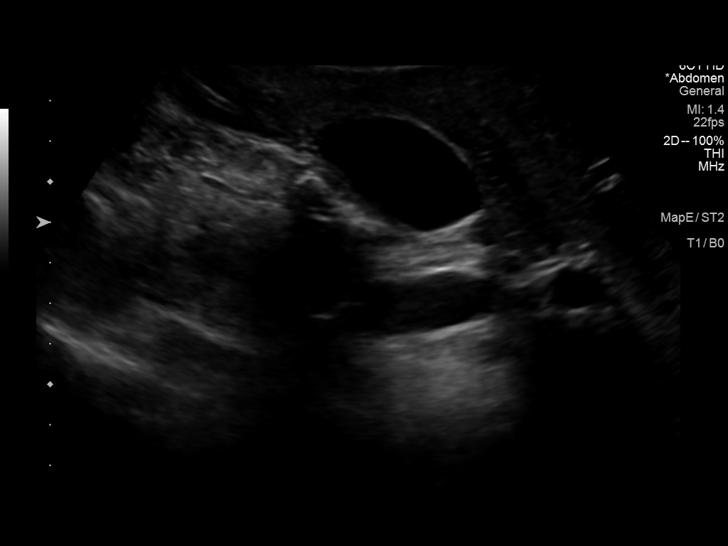
[im 22/85]
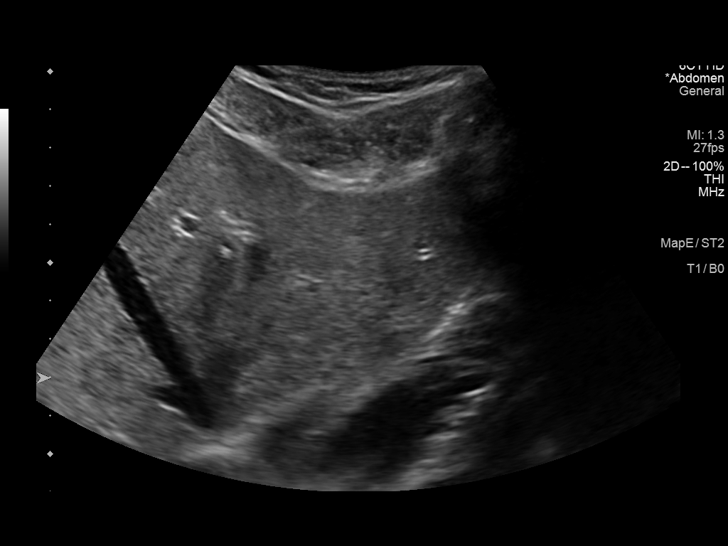
[im 29/85]
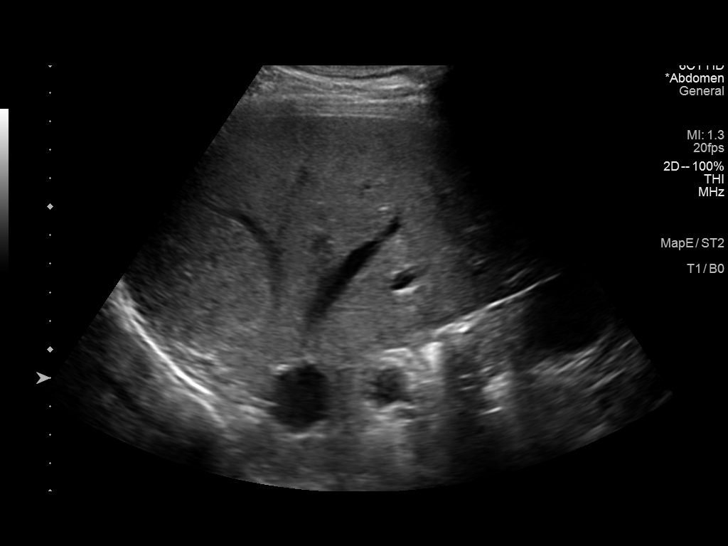
[im 32/85]
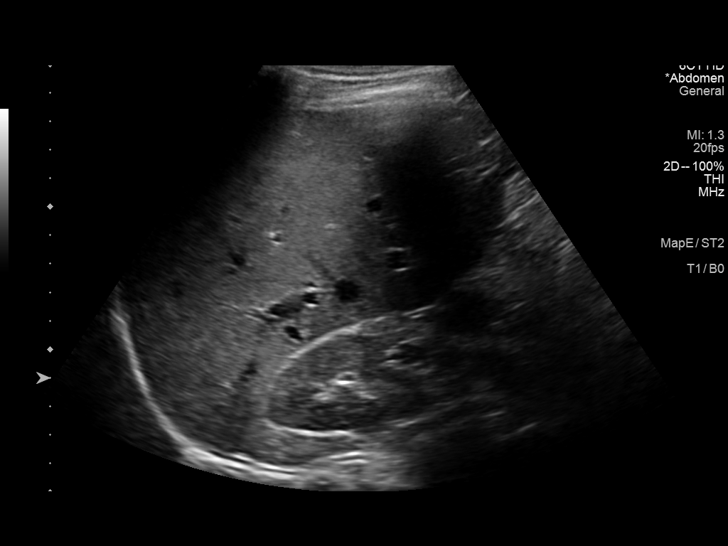
[im 39/85]
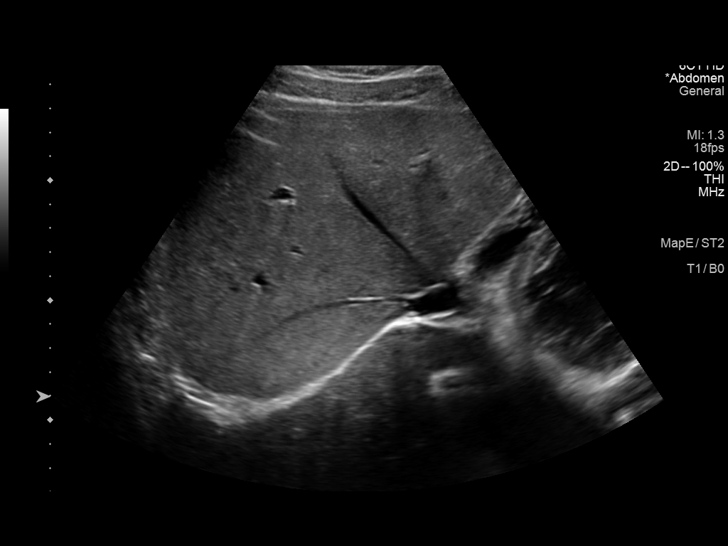
[im 46/85]
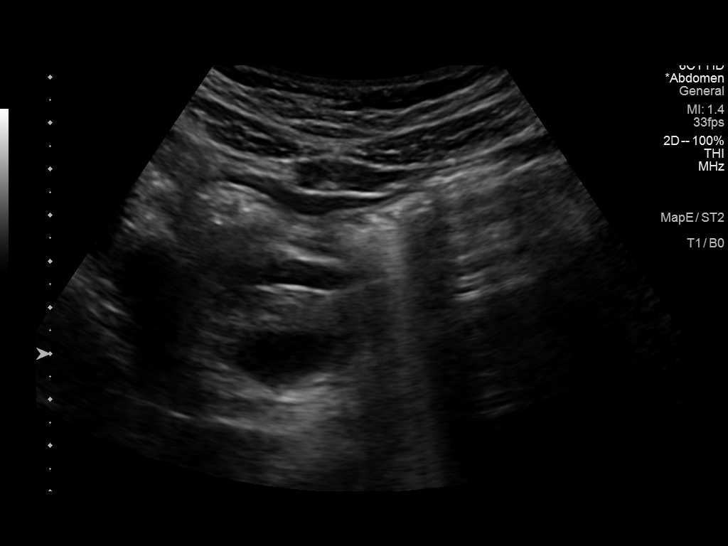
[im 53/85]
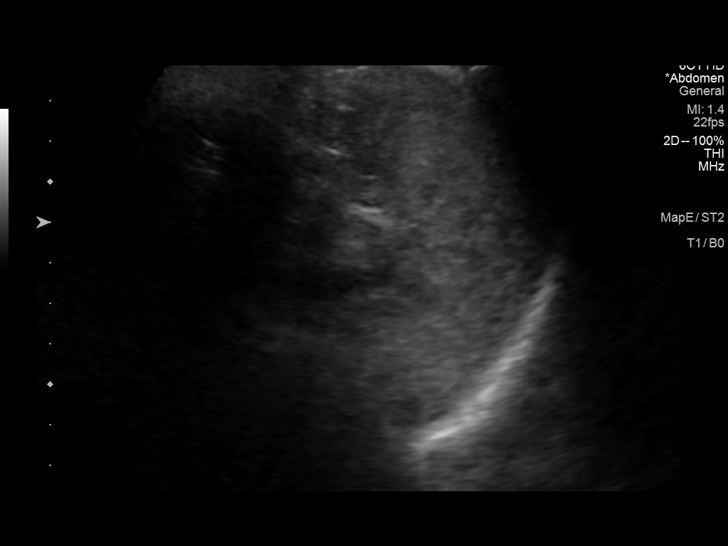
[im 57/85]
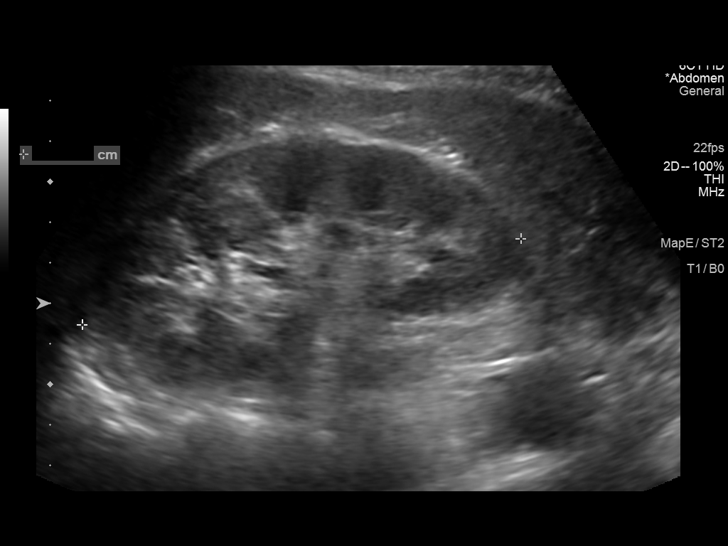
[im 64/85]
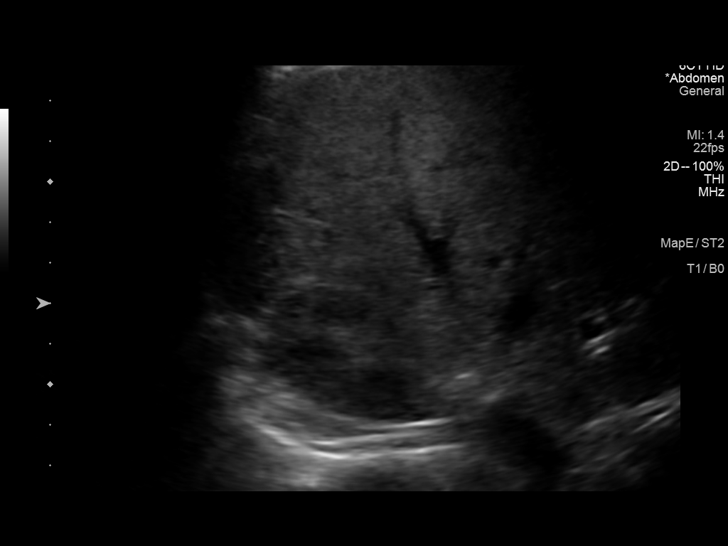
[im 71/85]
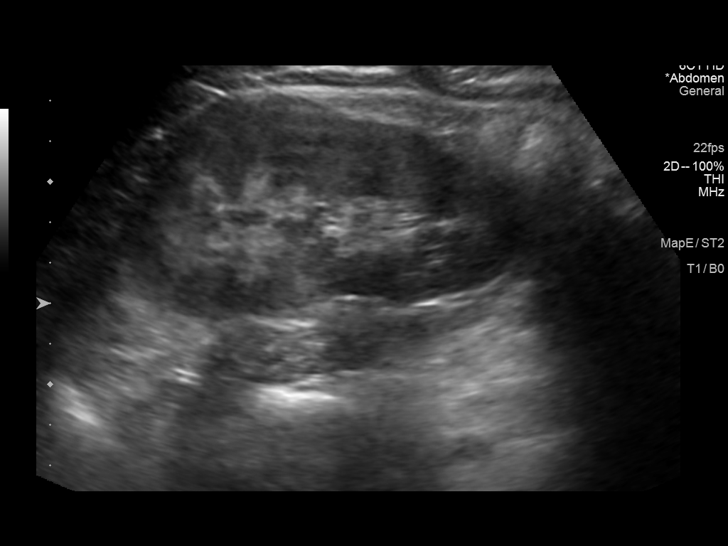
[im 78/85]
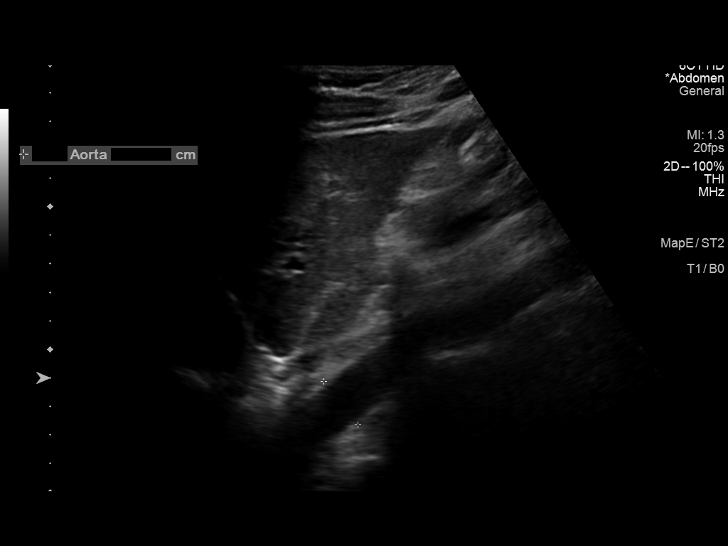
[im 85/85]
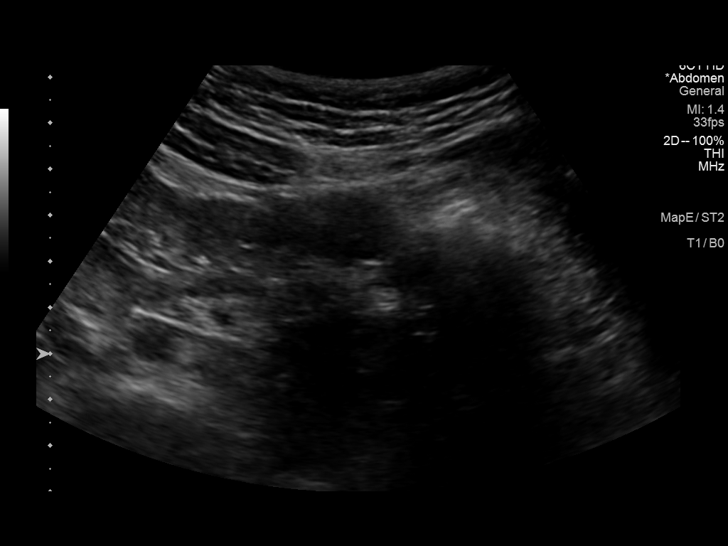

[14 of 25 positions shown; findings below may reference images not displayed]

FINDINGS: Gallbladder: No gallstones or wall thickening visualized. No
sonographic Murphy sign noted by sonographer.

Common bile duct: Diameter: Normal caliber, 3 mm

Liver: No focal lesion identified. Within normal limits in
parenchymal echogenicity. Portal vein is patent on color Doppler
imaging with normal direction of blood flow towards the liver.

IVC: No abnormality visualized.

Pancreas: Visualized portion unremarkable.

Spleen: Size and appearance within normal limits.

Right Kidney: Length: 11.0 cm. Echogenicity within normal limits. No
mass or hydronephrosis visualized.

Left Kidney: Length: 10.0 cm. Echogenicity within normal limits. No
mass or hydronephrosis visualized.

Abdominal aorta: No aneurysm visualized.

Other findings: None.
IMPRESSION: Unremarkable abdominal ultrasound.

## 2022-07-27 ENCOUNTER — Other Ambulatory Visit: Payer: Self-pay | Admitting: Family Medicine

## 2022-07-27 DIAGNOSIS — F10930 Alcohol use, unspecified with withdrawal, uncomplicated: Secondary | ICD-10-CM

## 2022-08-07 ENCOUNTER — Other Ambulatory Visit: Payer: Self-pay | Admitting: Family Medicine

## 2022-08-07 DIAGNOSIS — F10930 Alcohol use, unspecified with withdrawal, uncomplicated: Secondary | ICD-10-CM

## 2022-08-09 MED ORDER — VITAMIN B-12 1000 MCG PO TABS: 1000.0000 ug | ORAL_TABLET | Freq: Every day | ORAL | 0 refills | Status: DC

## 2022-09-01 ENCOUNTER — Other Ambulatory Visit: Payer: Self-pay | Admitting: Family Medicine

## 2022-09-01 DIAGNOSIS — F10930 Alcohol use, unspecified with withdrawal, uncomplicated: Secondary | ICD-10-CM

## 2023-02-13 ENCOUNTER — Ambulatory Visit (HOSPITAL_COMMUNITY)
Admission: EM | Admit: 2023-02-13 | Discharge: 2023-02-13 | Disposition: A | Discharge status: Home / Self Care | Payer: 59 | Attending: Urgent Care | Admitting: Urgent Care

## 2023-02-13 ENCOUNTER — Telehealth: Payer: Self-pay | Admitting: Physician Assistant

## 2023-02-13 ENCOUNTER — Encounter (HOSPITAL_COMMUNITY): Payer: Self-pay

## 2023-02-13 DIAGNOSIS — F1010 Alcohol abuse, uncomplicated: Secondary | ICD-10-CM | POA: Insufficient documentation

## 2023-02-13 DIAGNOSIS — R300 Dysuria: Secondary | ICD-10-CM | POA: Diagnosis present

## 2023-02-13 DIAGNOSIS — R112 Nausea with vomiting, unspecified: Secondary | ICD-10-CM | POA: Diagnosis not present

## 2023-02-13 LAB — POCT URINALYSIS DIP (MANUAL ENTRY)
Bilirubin, UA: NEGATIVE
Blood, UA: NEGATIVE
Glucose, UA: NEGATIVE mg/dL
Leukocytes, UA: NEGATIVE
Nitrite, UA: NEGATIVE
Protein Ur, POC: 30 mg/dL — AB
Spec Grav, UA: >=1.03 — AB (ref 1.010–1.025)
Urobilinogen, UA: 1 E.U./dL
pH, UA: 6 (ref 5.0–8.0)

## 2023-02-13 MED ORDER — ONDANSETRON 4 MG PO TBDP: 4.0000 mg | ORAL_TABLET | Freq: Three times a day (TID) | ORAL | 0 refills | Status: AC | PRN

## 2023-02-13 MED ORDER — SULFAMETHOXAZOLE-TRIMETHOPRIM 800-160 MG PO TABS: 1.0000 | ORAL_TABLET | Freq: Two times a day (BID) | ORAL | 0 refills | Status: AC

## 2023-02-13 NOTE — ED Provider Notes (Signed)
[facility]    CSN: [id number] Arrival date & time: 02/13/23  1328      History   Chief Complaint Chief Complaint  Patient presents with   Urinary Tract Infection   SEXUALLY TRANSMITTED DISEASE    HPI Erik Garza is a 42 y.o. male.   43yo male presents today with c/o dysuria since Sunday. Reports having had unprotected sex with a new partner on Friday, with sx onset on Sunday. Pt reports this morning, he peed out some blood x 1 episode. He denies any rash or lesions. No discharge, testicular pain or swelling. Pt admits to a hx of prostatitis in the past. Also has hx of kidney stones several years ago. Pt states he has had a large amount of alcohol in the past few days and has noticed urinrary sx with increased ETOH use int he past. Last bought of prostatitis was in October, tx with bactrim at that time. Pt states it helped, and he tolerated it well. Pt admits to having 6 shots of liquor and 6 beers last evening. Denies abdominal pain today, but does report nausea with one episode of vomiting. He denies any CVA tenderness or fever.    Urinary Tract Infection   Past Medical History:  Diagnosis Date   EtOH dependence (HCC)    Kidney stones     Patient Active Problem List   Diagnosis Date Noted   Hx of prostatitis 06/30/2022    History reviewed. No pertinent surgical history.     Home Medications    Prior to Admission medications   Medication Sig Start Date End Date Taking? Authorizing Provider  ondansetron (ZOFRAN-ODT) 4 MG disintegrating tablet Take 1 tablet (4 mg total) by mouth every 8 (eight) hours as needed for nausea or vomiting. 02/13/23  Yes Jaydyn Bozzo L, PA  sulfamethoxazole-trimethoprim (BACTRIM DS) 800-160 MG tablet Take 1 tablet by mouth 2 (two) times daily for 10 days. 02/13/23 02/23/23 Yes Charrise Lardner, Jodelle Gross, PA    Family History History reviewed. No pertinent family history.  Social History Social History   Tobacco Use   Smoking status:  Never   Smokeless tobacco: Never  Substance Use Topics   Alcohol use: Yes    Comment: 3-4 beer daily, 3-4 shots daily   Drug use: Never     Allergies   Patient has no known allergies.   Review of Systems Review of Systems As per HPI  Physical Exam Triage Vital Signs ED Triage Vitals  Enc Vitals Group     BP 02/13/23 1357 (!) 141/92     Pulse Rate 02/13/23 1357 96     Resp 02/13/23 1357 18     Temp 02/13/23 1357 98.1 F (36.7 C)     Temp Source 02/13/23 1357 Oral     SpO2 02/13/23 1357 96 %     Weight --      Height --      Head Circumference --      Peak Flow --      Pain Score 02/13/23 1358 0     Pain Loc --      Pain Edu? --      Excl. in GC? --    No data found.  Updated Vital Signs BP (!) 141/92 (BP Location: Left Arm)   Pulse 96   Temp 98.1 F (36.7 C) (Oral)   Resp 18   SpO2 96%   Visual Acuity Right Eye Distance:   Left Eye Distance:   Bilateral Distance:  Right Eye Near:   Left Eye Near:    Bilateral Near:     Physical Exam Vitals and nursing note reviewed.  Constitutional:      General: He is not in acute distress.    Appearance: Normal appearance. He is well-developed and normal weight. He is not ill-appearing, toxic-appearing or diaphoretic.  HENT:     Head: Normocephalic and atraumatic.     Mouth/Throat:     Pharynx: Oropharynx is clear.     Comments: Borderline dry mucous membranes Eyes:     General: No scleral icterus.       Right eye: No discharge.        Left eye: No discharge.     Extraocular Movements: Extraocular movements intact.     Conjunctiva/sclera: Conjunctivae normal.     Pupils: Pupils are equal, round, and reactive to light.  Cardiovascular:     Rate and Rhythm: Normal rate and regular rhythm.     Heart sounds: No murmur heard. Pulmonary:     Effort: Pulmonary effort is normal. No respiratory distress.     Breath sounds: Normal breath sounds.  Abdominal:     General: Abdomen is flat. Bowel sounds are normal.  There is no distension.     Palpations: Abdomen is soft. There is no mass.     Tenderness: There is no abdominal tenderness. There is no right CVA tenderness, left CVA tenderness, guarding or rebound.     Hernia: No hernia is present.  Genitourinary:    Comments: Prostate exam deferred Musculoskeletal:        General: No swelling.     Cervical back: Normal range of motion and neck supple. No rigidity.  Skin:    General: Skin is warm and dry.     Capillary Refill: Capillary refill takes less than 2 seconds.  Neurological:     Mental Status: He is alert.  Psychiatric:        Mood and Affect: Mood normal.      UC Treatments / Results  Labs (all labs ordered are listed, but only abnormal results are displayed) Labs Reviewed  POCT URINALYSIS DIP (MANUAL ENTRY) - Abnormal; Notable for the following components:      Result Value   Ketones, POC UA large (80) (*)    Spec Grav, UA >=1.030 (*)    Protein Ur, POC =30 (*)    All other components within normal limits  CYTOLOGY, (ORAL, ANAL, URETHRAL) ANCILLARY ONLY    EKG   Radiology No results found.  Procedures Procedures (including critical care time)  Medications Ordered in UC Medications - No data to display  Initial Impression / Assessment and Plan / UC Course  I have reviewed the triage vital signs and the nursing notes.  Pertinent labs & imaging results that were available during my care of the patient were reviewed by me and considered in my medical decision making (see chart for details).     Dysuria -UA negative for UTI.  Patient does have a significant history of prostatitis in the past and admits this feels similar.  I question whether there this is acute bacterial prostatitis or whether this is nonbacterial secondary to alcohol abuse.  Nevertheless he has done well with Bactrim in the past.  We will therefore restart this again today.  He has an appointment with his primary care physician on Friday, I strongly  encouraged that he keep this appointment.  Given his recent unprotected sex as well, and Aptima swab was  obtained. Nausea and vomiting -negative symptoms of pancreatitis in office today.  Patient admits this happens frequently when he drinks too much alcohol and feels this is similar.  Will give Zofran for as needed use.  We did discuss cutting back which patient would like to do. Alcohol abuse -patient took Librium and clonidine previously under the care of his PCP.  He would like to follow-up with PCP to further discuss cutting back and or cessation completely.   Final Clinical Impressions(s) / UC Diagnoses   Final diagnoses:  Dysuria  Nausea and vomiting, unspecified vomiting type  Alcohol abuse     Discharge Instructions      Your urine sample shows dehydration, but no signs of a urinary tract infection. We will call with results of your Aptima swab if they are positive. Please avoid all forms of intercourse until results are received.  Symptoms may be consistent with prostatitis, we will start you on Bactrim.  Please take this twice daily until gone.  Drink plenty of water while taking and avoid excessive sun exposure.  Alcohol is a urinary and prostate irritant, therefore this could be the underlying cause of your symptoms. Please keep your follow-up appointment on Friday with your primary care physician to further discuss.  I have called in Zofran for you to use if your nausea or vomiting continues.  Please place 1 tablet underneath your tongue every 8 hours as needed.    ED Prescriptions     Medication Sig Dispense Auth. Provider   sulfamethoxazole-trimethoprim (BACTRIM DS) 800-160 MG tablet Take 1 tablet by mouth 2 (two) times daily for 10 days. 20 tablet Corrine Tillis L, PA   ondansetron (ZOFRAN-ODT) 4 MG disintegrating tablet Take 1 tablet (4 mg total) by mouth every 8 (eight) hours as needed for nausea or vomiting. 20 tablet Yvette Roark L, [place]      PDMP not  reviewed this encounter.   Maretta Bees, [place] 02/13/23 1524

## 2023-02-13 NOTE — Progress Notes (Signed)
Patient currently in ER. Visit closed. No charge.

## 2023-02-13 NOTE — ED Triage Notes (Signed)
Pt c/o burning on urination with blood/frequency and urgency. States has been drinking a lot in past two day and had unprotected intercourse.

## 2023-02-13 NOTE — Discharge Instructions (Addendum)
Your urine sample shows dehydration, but no signs of a urinary tract infection. We will call with results of your Aptima swab if they are positive. Please avoid all forms of intercourse until results are received.  Symptoms may be consistent with prostatitis, we will start you on Bactrim.  Please take this twice daily until gone.  Drink plenty of water while taking and avoid excessive sun exposure.  Alcohol is a urinary and prostate irritant, therefore this could be the underlying cause of your symptoms. Please keep your follow-up appointment on Friday with your primary care physician to further discuss.  I have called in Zofran for you to use if your nausea or vomiting continues.  Please place 1 tablet underneath your tongue every 8 hours as needed.

## 2023-02-14 LAB — CYTOLOGY, (ORAL, ANAL, URETHRAL) ANCILLARY ONLY
Chlamydia: NEGATIVE
Comment: NEGATIVE
Comment: NEGATIVE
Comment: NORMAL
Neisseria Gonorrhea: POSITIVE — AB
Trichomonas: NEGATIVE

## 2023-02-15 ENCOUNTER — Other Ambulatory Visit: Payer: Self-pay

## 2023-02-15 ENCOUNTER — Encounter (HOSPITAL_BASED_OUTPATIENT_CLINIC_OR_DEPARTMENT_OTHER): Payer: Self-pay | Admitting: Emergency Medicine

## 2023-02-15 ENCOUNTER — Emergency Department (HOSPITAL_BASED_OUTPATIENT_CLINIC_OR_DEPARTMENT_OTHER)
Admission: EM | Admit: 2023-02-15 | Discharge: 2023-02-15 | Disposition: A | Discharge status: Home / Self Care | Payer: 59 | Attending: Emergency Medicine | Admitting: Emergency Medicine

## 2023-02-15 DIAGNOSIS — R300 Dysuria: Secondary | ICD-10-CM | POA: Diagnosis present

## 2023-02-15 DIAGNOSIS — A549 Gonococcal infection, unspecified: Secondary | ICD-10-CM | POA: Insufficient documentation

## 2023-02-15 MED ORDER — CEFTRIAXONE SODIUM 500 MG IJ SOLR
500.0000 mg | Freq: Once | INTRAMUSCULAR | Status: AC
  Administered 2023-02-15: 500 mg via INTRAMUSCULAR
  Filled 2023-02-15: qty 500

## 2023-02-15 NOTE — ED Triage Notes (Signed)
Pt c/o burning with urination. Recent labs at [facility].

## 2023-02-15 NOTE — ED Provider Notes (Signed)
[place] [facility] Provider Note   CSN: [id number] Arrival date & time: 02/15/23  0725     History  Chief Complaint  Patient presents with   Dysuria    Erik Garza is a 42 y.o. male.  Patient is a 42 year old male with a history of kidney stones, regular alcohol use and recurrent prostatitis with last episode in October who is presenting today due to positive gonorrhea culture that was done 2 days ago.  Patient had presented to urgent care because of blood in his urine and some discomfort after having unprotected sex with a new partner several days before that.  At that time patient was thought to have possible recurrent prostatitis with a negative UA but a GC chlamydia culture was sent.  Yesterday that culture came back positive for gonorrhea.  Patient was contacted and told to receive treatment.  He was placed on Bactrim at that time for presumed recurrent prostatitis but also discussed alcohol cessation and follow-up with his PCP as well as trying to stay hydrated.  Since leaving urgent care pt has had persistent dysuria but no other sx.  The history is provided by the patient.       Home Medications Prior to Admission medications   Medication Sig Start Date End Date Taking? Authorizing Provider  ondansetron (ZOFRAN-ODT) 4 MG disintegrating tablet Take 1 tablet (4 mg total) by mouth every 8 (eight) hours as needed for nausea or vomiting. 02/13/23   Crain, Tony Friscia L, PA  sulfamethoxazole-trimethoprim (BACTRIM DS) 800-160 MG tablet Take 1 tablet by mouth 2 (two) times daily for 10 days. 02/13/23 02/23/23  Guy Sandifer L, PA      Allergies    Patient has no known allergies.    Review of Systems   Review of Systems  Physical Exam Updated Vital Signs BP (!) 150/98   Temp 98 F (36.7 C) (Oral)   Ht 5\' 11"  (1.803 m)   Wt 81.6 kg   BMI 25.10 kg/m  Physical Exam Vitals and nursing note reviewed.  Cardiovascular:     Rate and Rhythm:  Normal rate.  Pulmonary:     Effort: Pulmonary effort is normal.  Abdominal:     Palpations: Abdomen is soft.  Neurological:     Mental Status: He is alert. Mental status is at baseline.     ED Results / Procedures / Treatments   Labs (all labs ordered are listed, but only abnormal results are displayed) Labs Reviewed - No data to display  EKG None  Radiology No results found.  Procedures Procedures    Medications Ordered in ED Medications  cefTRIAXone (ROCEPHIN) injection 500 mg (has no administration in time range)    ED Course/ Medical Decision Making/ A&P                             Medical Decision Making Risk Prescription drug management.   Patient seen 2 days ago due to GU symptoms and culture yesterday came back positive for gonorrhea.  Discussed with pt testing for HIV and syphilis but he deferred and states he is seeing his doctor on Tuesday and will have it done then.  Patient treated with IM Rocephin.        Final Clinical Impression(s) / ED Diagnoses Final diagnoses:  Gonorrhea    Rx / DC Orders ED Discharge Orders     None  Gwyneth Sprout, MD 02/15/23 [phone]

## 2023-02-15 NOTE — Discharge Instructions (Addendum)
You were treated for gonorrhea today.  You can stop the antibiotic you were given 2 days ago.  No sexual contact for 2 weeks.  Partner needs treatment.  In the future to avoid getting this again condoms significantly prevent spread of infection.
